# Patient Record
Sex: Female | Born: 1950 | Race: White | Hispanic: No
Health system: Southern US, Community
[De-identification: ages and names within clinical notes are randomized; demographics above are authoritative.]

---

## 2016-05-04 ENCOUNTER — Other Ambulatory Visit: Payer: Self-pay | Admitting: Family Medicine

## 2016-05-04 DIAGNOSIS — M5416 Radiculopathy, lumbar region: Secondary | ICD-10-CM

## 2016-05-17 ENCOUNTER — Other Ambulatory Visit: Payer: Self-pay | Admitting: Family Medicine

## 2016-05-17 ENCOUNTER — Ambulatory Visit
Admission: RE | Admit: 2016-05-17 | Discharge: 2016-05-17 | Disposition: A | Discharge status: Home / Self Care | Payer: Medicare Other | Source: Ambulatory Visit | Attending: Family Medicine | Admitting: Family Medicine

## 2016-05-17 DIAGNOSIS — M5416 Radiculopathy, lumbar region: Secondary | ICD-10-CM

## 2016-05-17 DIAGNOSIS — M79605 Pain in left leg: Secondary | ICD-10-CM

## 2016-05-17 MED ORDER — METHYLPREDNISOLONE ACETATE 40 MG/ML INJ SUSP (RADIOLOG
120.0000 mg | Freq: Once | INTRAMUSCULAR | Status: AC
  Administered 2016-05-17: 120 mg via EPIDURAL

## 2016-05-17 MED ORDER — IOPAMIDOL (ISOVUE-M 200) INJECTION 41%
1.0000 mL | Freq: Once | INTRAMUSCULAR | Status: AC
  Administered 2016-05-17: 1 mL via EPIDURAL

## 2016-05-17 NOTE — Discharge Instructions (Signed)

## 2016-05-31 ENCOUNTER — Other Ambulatory Visit: Payer: Medicare Other

## 2018-07-19 IMAGING — XA Imaging study
3 series · 3 of 3 positions shown · non-contrast
Comparison: none

CLINICAL DATA: Left greater than right lower extremity radiculitis.
Displacement of the L4-5 lumbar disc.

[Series 1: ortho standard · 1 of 1 slices shown (1 of 3)]
[im 1/1]
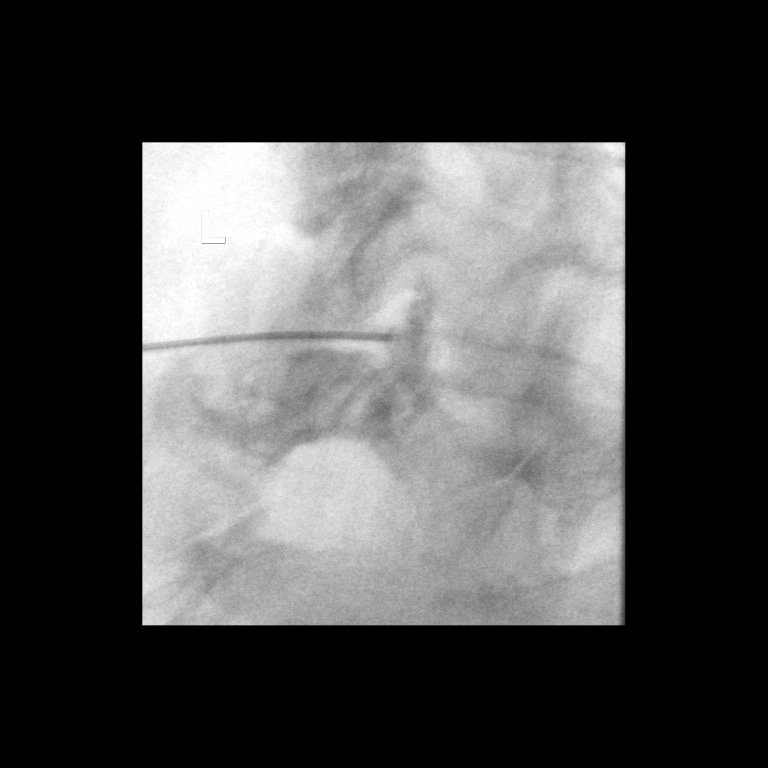

[Series 2: ortho standard · 1 of 1 slices shown (2 of 3)]
[im 1/1]
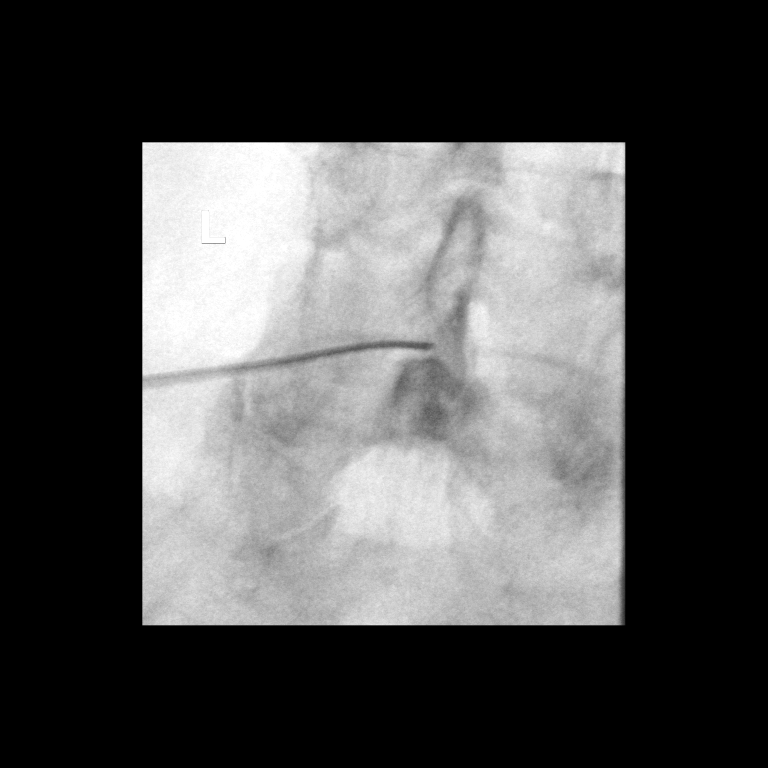

[Series 3: ortho standard · 1 of 1 slices shown (3 of 3)]
[im 1/1]
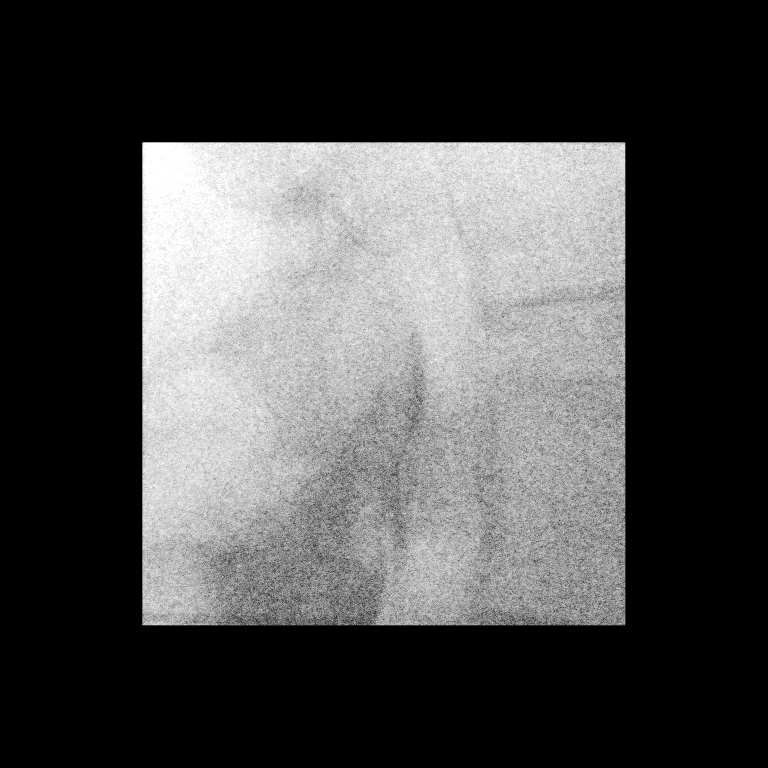

[3 of 3 positions shown; findings below may reference images not displayed]

FLUOROSCOPY TIME:  Radiation Exposure Index (as provided by the
fluoroscopic device): 35.18 uGy*m2

If the device does not provide the exposure index:Fluoroscopy Time:
33 seconds

Number of Acquired Images:  0

PROCEDURE:
The procedure, risks, benefits, and alternatives were explained to
the patient. Questions regarding the procedure were encouraged and
answered. The patient understands and consents to the procedure.

LUMBAR EPIDURAL INJECTION:

An interlaminar approach was performed on left at L4-5. The
overlying skin was cleansed and anesthetized. A 20 gauge epidural
needle was advanced using loss-of-resistance technique.

DIAGNOSTIC EPIDURAL INJECTION:

Injection of Isovue-M 200 shows a good epidural pattern with spread
above and below the level of needle placement, bilaterally. No
vascular opacification is seen.

THERAPEUTIC EPIDURAL INJECTION:

120 Mg of Depo-Medrol mixed with 3 mL 1% lidocaine were instilled.
The procedure was well-tolerated, and the patient was discharged
thirty minutes following the injection in good condition.

COMPLICATIONS:
None
IMPRESSION: Technically successful epidural injection on the left L4-5 # 1
